# Patient Record
Sex: Male | Born: 1960 | Hispanic: No | Marital: Married | Smoking: Never smoker
Health system: Southern US, Community
[De-identification: ages and names within clinical notes are randomized; demographics above are authoritative.]

## PROBLEM LIST (undated history)

## (undated) DIAGNOSIS — E079 Disorder of thyroid, unspecified: Secondary | ICD-10-CM

## (undated) HISTORY — DX: Disorder of thyroid, unspecified: E07.9

## (undated) HISTORY — PX: NASAL SEPTUM SURGERY: SHX37

## (undated) HISTORY — PX: INGUINAL HERNIA REPAIR: SUR1180

---

## 2008-11-05 ENCOUNTER — Ambulatory Visit: Payer: Self-pay | Admitting: Internal Medicine

## 2008-11-10 HISTORY — PX: COLONOSCOPY: SHX174

## 2008-11-13 ENCOUNTER — Ambulatory Visit: Payer: Self-pay | Admitting: Internal Medicine

## 2012-05-10 HISTORY — PX: ANKLE FRACTURE SURGERY: SHX122

## 2013-08-22 ENCOUNTER — Encounter: Payer: Self-pay | Admitting: Internal Medicine

## 2013-10-23 ENCOUNTER — Encounter: Payer: Self-pay | Admitting: Internal Medicine

## 2013-10-31 ENCOUNTER — Encounter: Payer: Self-pay | Admitting: Internal Medicine

## 2013-12-16 ENCOUNTER — Ambulatory Visit (AMBULATORY_SURGERY_CENTER): Payer: Self-pay | Admitting: *Deleted

## 2013-12-16 VITALS — Ht 71.0 in | Wt 210.0 lb

## 2013-12-16 DIAGNOSIS — Z800 Family history of malignant neoplasm of digestive organs: Secondary | ICD-10-CM

## 2013-12-16 MED ORDER — MOVIPREP 100 G PO SOLR: ORAL | Status: DC

## 2013-12-16 NOTE — Progress Notes (Signed)
Patient denies any allergies to eggs or soy. Patient denies any problems with anesthesia/sedation. Patient denies any oxygen use at home and does not take any diet/weight loss medications. EMMI education assisgned to patient on colonoscopy, this was explained and instructions given to patient. 

## 2013-12-30 ENCOUNTER — Encounter: Payer: Self-pay | Admitting: Internal Medicine

## 2013-12-30 ENCOUNTER — Ambulatory Visit (AMBULATORY_SURGERY_CENTER): Payer: 59 | Admitting: Internal Medicine

## 2013-12-30 VITALS — BP 121/84 | HR 62 | Temp 96.7°F | Resp 19 | Ht 71.0 in | Wt 210.0 lb

## 2013-12-30 DIAGNOSIS — Z800 Family history of malignant neoplasm of digestive organs: Secondary | ICD-10-CM

## 2013-12-30 DIAGNOSIS — Z1211 Encounter for screening for malignant neoplasm of colon: Secondary | ICD-10-CM

## 2013-12-30 MED ORDER — SODIUM CHLORIDE 0.9 % IV SOLN
500.0000 mL | INTRAVENOUS | Status: DC
Start: 2013-12-30 — End: 2013-12-30

## 2013-12-30 NOTE — Progress Notes (Signed)
Report to PACU, RN, vss, BBS= Clear.  

## 2013-12-30 NOTE — Patient Instructions (Signed)
Discharge instructions given. Handouts on diverticulosis and a high fiber diet. Resume previous medications. YOU HAD AN ENDOSCOPIC PROCEDURE TODAY AT THE [facility]: Refer to the procedure report that was given to you for any specific questions about what was found during the examination.  If the procedure report does not answer your questions, please call your gastroenterologist to clarify.  If you requested that your care partner not be given the details of your procedure findings, then the procedure report has been included in a sealed envelope for you to review at your convenience later.  YOU SHOULD EXPECT: Some feelings of bloating in the abdomen. Passage of more gas than usual.  Walking can help get rid of the air that was put into your GI tract during the procedure and reduce the bloating. If you had a lower endoscopy (such as a colonoscopy or flexible sigmoidoscopy) you may notice spotting of blood in your stool or on the toilet paper. If you underwent a bowel prep for your procedure, then you may not have a normal bowel movement for a few days.  DIET: Your first meal following the procedure should be a light meal and then it is ok to progress to your normal diet.  A half-sandwich or bowl of soup is an example of a good first meal.  Heavy or fried foods are harder to digest and may make you feel nauseous or bloated.  Likewise meals heavy in dairy and vegetables can cause extra gas to form and this can also increase the bloating.  Drink plenty of fluids but you should avoid alcoholic beverages for 24 hours.  ACTIVITY: Your care partner should take you home directly after the procedure.  You should plan to take it easy, moving slowly for the rest of the day.  You can resume normal activity the day after the procedure however you should NOT DRIVE or use heavy machinery for 24 hours (because of the sedation medicines used during the test).    SYMPTOMS TO REPORT IMMEDIATELY: A  gastroenterologist can be reached at any hour.  During normal business hours, 8:30 AM to 5:00 PM Monday through Friday, call [phone].  After hours and on weekends, please call the GI answering service at [phone] who will take a message and have the physician on call contact you.   Following lower endoscopy (colonoscopy or flexible sigmoidoscopy):  Excessive amounts of blood in the stool  Significant tenderness or worsening of abdominal pains  Swelling of the abdomen that is new, acute  Fever of 100F or higher  FOLLOW UP: If any biopsies were taken you will be contacted by phone or by letter within the next 1-3 weeks.  Call your gastroenterologist if you have not heard about the biopsies in 3 weeks.  Our staff will call the home number listed on your records the next business day following your procedure to check on you and address any questions or concerns that you may have at that time regarding the information given to you following your procedure. This is a courtesy call and so if there is no answer at the home number and we have not heard from you through the emergency physician on call, we will assume that you have returned to your regular daily activities without incident.  SIGNATURES/CONFIDENTIALITY: You and/or your care partner have signed paperwork which will be entered into your electronic medical record.  These signatures attest to the fact that that the information above on your After Visit Summary   has been reviewed and is understood.  Full responsibility of the confidentiality of this discharge information lies with you and/or your care-partner. 

## 2013-12-30 NOTE — Op Note (Signed)
[facility]  [address] [place], [address]   COLONOSCOPY PROCEDURE REPORT  PATIENT: Randall Mason, Randall Mason  MR#: [id number] BIRTHDATE: 1961-01-08 , 63  yrs. old GENDER: male ENDOSCOPIST: Eustace Quail, MD REFERRED NB:VAPOLIDCV Recall, PROCEDURE DATE:  12/30/2013 PROCEDURE:   Colonoscopy, screening First Screening Colonoscopy - Avg.  risk and is 50 yrs.  old or older - No.  Prior Negative Screening - Now for repeat screening. N/A  History of Adenoma - Now for follow-up colonoscopy & has been > or = to 3 yrs.  N/A  Polyps Removed Today? No.  Recommend repeat exam, <10 yrs? Polyps Removed Today? No.  Recommend repeat exam, <10 yrs? Yes.  Polyps Removed Today? No.  Recommend repeat exam, <10 yrs? Yes.  High risk (family or personal hx). ASA CLASS:   Class II INDICATIONS:patient's immediate family history of colon cancer. Father at 95. Index examination November 2010-negative. MEDICATIONS: Monitored anesthesia care and Propofol 200 mg IV  DESCRIPTION OF PROCEDURE:   After the risks benefits and alternatives of the procedure were thoroughly explained, informed consent was obtained.  The digital rectal exam revealed no abnormalities of the rectum.   The LB [id number]  endoscope was introduced through the anus and advanced to the cecum, which was identified by both the appendix and ileocecal valve. No adverse events experienced.   The quality of the prep was excellent, using MoviPrep  The instrument was then slowly withdrawn as the colon was fully examined.    COLON FINDINGS: There was moderate diverticulosis noted in the left colon and transverse colon.   The examination was otherwise normal. Retroflexed views revealed internal hemorrhoids. The time to cecum=3 minutes 29 seconds.  Withdrawal time=9 minutes 47 seconds. The scope was withdrawn and the procedure completed.  COMPLICATIONS: There were no immediate complications.  ENDOSCOPIC IMPRESSION: 1.    Moderate diverticulosis was noted in the left colon and transverse colon 2.   The examination was otherwise normal  RECOMMENDATIONS: 1. Follow up colonoscopy in 5 years (family history)  eSigned:  Eustace Quail, MD 12/30/2013 11:03 AM   cc: Burnard Bunting, MD and The Patient

## 2013-12-31 ENCOUNTER — Telehealth: Payer: Self-pay

## 2013-12-31 NOTE — Telephone Encounter (Signed)
Left message on answering machine. 

## 2016-04-19 ENCOUNTER — Ambulatory Visit (INDEPENDENT_AMBULATORY_CARE_PROVIDER_SITE_OTHER): Payer: Self-pay

## 2016-04-19 ENCOUNTER — Ambulatory Visit (INDEPENDENT_AMBULATORY_CARE_PROVIDER_SITE_OTHER): Payer: 59

## 2016-04-19 ENCOUNTER — Other Ambulatory Visit (INDEPENDENT_AMBULATORY_CARE_PROVIDER_SITE_OTHER): Payer: Self-pay | Admitting: Orthopedic Surgery

## 2016-04-19 ENCOUNTER — Other Ambulatory Visit (INDEPENDENT_AMBULATORY_CARE_PROVIDER_SITE_OTHER): Payer: Self-pay | Admitting: *Deleted

## 2016-04-19 ENCOUNTER — Ambulatory Visit (INDEPENDENT_AMBULATORY_CARE_PROVIDER_SITE_OTHER): Payer: 59 | Admitting: Orthopaedic Surgery

## 2016-04-19 DIAGNOSIS — M5440 Lumbago with sciatica, unspecified side: Secondary | ICD-10-CM | POA: Diagnosis not present

## 2016-04-19 DIAGNOSIS — R102 Pelvic and perineal pain: Secondary | ICD-10-CM

## 2016-04-19 DIAGNOSIS — G8929 Other chronic pain: Secondary | ICD-10-CM

## 2016-04-19 NOTE — Addendum Note (Signed)
Addended by: Biagio Borg on: 04/19/2016 03:36 PM   Modules accepted: Orders

## 2016-04-19 NOTE — Progress Notes (Signed)
   Office Visit Note   Patient: Randall Mason           Date of Birth: 1960/03/06           MRN: [id number] Visit Date: 04/19/2016              Requested by: Burnard Bunting, MD [place], [place] [address] PCP: Geoffery Lyons, MD   Assessment & Plan: Visit Diagnoses:  1. Pelvic pain   2. Chronic low back pain with sciatica, sciatica laterality unspecified, unspecified back pain laterality   Probable compression fracture of L3 without complication  Plan: CT scan lumbar spine, return after study, lumbar support, continue with Celebrex for pain, urine analysis to rule out.hematuria  Follow-Up Instructions: No Follow-up on file.   Orders:  Orders Placed This Encounter  Procedures  . XR Lumbar Spine 2-3 Views  . XR Pelvis 1-2 Views   No orders of the defined types were placed in this encounter.     Procedures: No procedures performed   Clinical Data: No additional findings.   Subjective: Chief Complaint  Patient presents with  . Lower Back - Pain  . Injury    fell 7 ft landed on lower back, hx herniated disc issue    HPI Randall Mason noted initial onset of low back pain this past Saturday i.e. 3 days ago. He had climbed onto a brain barrel and slipped falling about 7-8 feet landing on his right hip on grass. He felt "entire lumbosacral spine pain" initially and was "sore all day. He was better by Sunday but still hasn't had significant relief of his pain since that time. He is experiencing low back pain with referred discomfort more to the right than to the left side. There is no buttock pain or lower extremity discomfort. He denies any bowel or bladder problems other than a "slight amount of blood my urine". He is on Celebrex. He denies any previous problems with the back pain Review of Systems   Objective: Vital Signs: There were no vitals taken for this visit.  Physical Exam  Ortho Exam straight leg raise negative bilaterally. Deep tendon  reflexes symmetrical. Neurovascular exam intact. No pain with range of motion of either hip. Prior right knee arthroscopy without any pain or effusion. No lower extremity swelling. No flank discomfort. No sacroiliac joint pain. No masses. No ecchymosis. Percussible tenderness at the lumbosacral junction.  Specialty Comments:  No specialty comments available.  Imaging: No results found.   PMFS History: There are no active problems to display for this patient.  Past Medical History:  Diagnosis Date  . Thyroid disease     Family History  Problem Relation Age of Onset  . Uterine cancer Mother   . Colon cancer Father 45    Past Surgical History:  Procedure Laterality Date  . ANKLE FRACTURE SURGERY Right 05/2012  . COLONOSCOPY  11/2008  . INGUINAL HERNIA REPAIR    . NASAL SEPTUM SURGERY     Social History   Occupational History  . Not on file.   Social History Main Topics  . Smoking status: Never Smoker  . Smokeless tobacco: Never Used  . Alcohol use Yes     Comment: occasional beer or wine monthly per pt/RM  . Drug use: No  . Sexual activity: Not on file

## 2016-04-20 ENCOUNTER — Ambulatory Visit (INDEPENDENT_AMBULATORY_CARE_PROVIDER_SITE_OTHER): Payer: Self-pay | Admitting: Orthopedic Surgery

## 2016-04-20 ENCOUNTER — Ambulatory Visit
Admission: RE | Admit: 2016-04-20 | Discharge: 2016-04-20 | Disposition: A | Discharge status: Home / Self Care | Payer: 59 | Source: Ambulatory Visit | Attending: Orthopaedic Surgery | Admitting: Orthopaedic Surgery

## 2016-04-20 DIAGNOSIS — M545 Low back pain: Secondary | ICD-10-CM | POA: Diagnosis not present

## 2016-04-20 DIAGNOSIS — G8929 Other chronic pain: Secondary | ICD-10-CM

## 2016-04-20 DIAGNOSIS — M5440 Lumbago with sciatica, unspecified side: Principal | ICD-10-CM

## 2016-04-20 LAB — URINALYSIS, ROUTINE W REFLEX MICROSCOPIC
BILIRUBIN URINE: NEGATIVE
Glucose, UA: NEGATIVE
Hgb urine dipstick: NEGATIVE
Ketones, ur: NEGATIVE
LEUKOCYTES UA: NEGATIVE
Nitrite: NEGATIVE
PROTEIN: NEGATIVE
Specific Gravity, Urine: 1.018 (ref 1.001–1.035)
pH: 6.5 (ref 5.0–8.0)

## 2016-04-20 LAB — URINE CULTURE: ORGANISM ID, BACTERIA: NO GROWTH

## 2016-04-21 ENCOUNTER — Ambulatory Visit (INDEPENDENT_AMBULATORY_CARE_PROVIDER_SITE_OTHER): Payer: 59 | Admitting: Orthopaedic Surgery

## 2016-05-02 ENCOUNTER — Ambulatory Visit (INDEPENDENT_AMBULATORY_CARE_PROVIDER_SITE_OTHER): Payer: 59 | Admitting: Orthopaedic Surgery

## 2016-05-02 VITALS — BP 131/74 | HR 73 | Ht 69.0 in | Wt 210.0 lb

## 2016-05-02 DIAGNOSIS — M545 Low back pain, unspecified: Secondary | ICD-10-CM

## 2016-05-02 NOTE — Progress Notes (Signed)
   Office Visit Note   Patient: Randall Mason           Date of Birth: 11/29/1960           MRN: [id number] Visit Date: 05/02/2016              Requested by: Burnard Bunting, MD [place], [place] [address] PCP: Geoffery Lyons, MD   Assessment & Plan: Visit Diagnoses:  1. Acute midline low back pain without sciatica   Acute compression fracture of the L3 vertebral body  Plan: Lumbar support, limited activity, return to the office 2-4 weeks for repeat films  Follow-Up Instructions: Return in about 1 month (around 06/01/2016).   Orders:  No orders of the defined types were placed in this encounter.  No orders of the defined types were placed in this encounter.     Procedures: No procedures performed   Clinical Data: No additional findings.   Subjective: Chief Complaint  Patient presents with  . Lower Back - Results    Mr. Brunty is a 56 y o that is here for CT lumbar results.  Caster is several weeks status post injury as previously described. He fell about 8 feet with acute onset of low back pain. Films demonstrated what appeared to be a compression fracture at L3. CT scan performed 04/20/2016 demonstrated a comminuted fracture through the central L3 vertebral body with 40-45% loss of vertebral body height. there was mild retropulsion of the posterior inferior endplate. The L3 pedicle and posterior elements appeared to be intact.  He has been wearing a lumbar support and doing quite well. He denies any numbness or tingling. Denies bowel or bladder dysfunction. He has minimal discomfort. He is completely aware of the diagnosis and is being very careful with his activities. HPI  Review of Systems   Objective: Vital Signs: BP 131/74   Pulse 73   Ht 5\' 9"  (1.753 m)   Wt 210 lb (95.3 kg)   BMI 31.01 kg/m   Physical Exam  Ortho Exam straight leg raise negative bilaterally reflexes are symmetrical. Motor and sensory exam intact. Very mild percussible  tenderness of the lower lumbar spine. Pelvis is level. No flank pain. Painless range of motion both hips  Specialty Comments:  No specialty comments available.  Imaging: No results found.   PMFS History: There are no active problems to display for this patient.  Past Medical History:  Diagnosis Date  . Thyroid disease     Family History  Problem Relation Age of Onset  . Uterine cancer Mother   . Colon cancer Father 12    Past Surgical History:  Procedure Laterality Date  . ANKLE FRACTURE SURGERY Right 05/2012  . COLONOSCOPY  11/2008  . INGUINAL HERNIA REPAIR    . NASAL SEPTUM SURGERY     Social History   Occupational History  . Not on file.   Social History Main Topics  . Smoking status: Never Smoker  . Smokeless tobacco: Never Used  . Alcohol use Yes     Comment: occasional beer or wine monthly per pt/RM  . Drug use: No  . Sexual activity: Not on file     Garald Balding, MD   Note - This record has been created using [employer].  Chart creation errors have been sought, but may not always  have been located. Such creation errors do not reflect on  the standard of medical care.

## 2016-06-23 ENCOUNTER — Ambulatory Visit (INDEPENDENT_AMBULATORY_CARE_PROVIDER_SITE_OTHER): Payer: 59

## 2016-06-23 ENCOUNTER — Ambulatory Visit (INDEPENDENT_AMBULATORY_CARE_PROVIDER_SITE_OTHER): Payer: 59 | Admitting: Orthopaedic Surgery

## 2016-06-23 ENCOUNTER — Encounter (INDEPENDENT_AMBULATORY_CARE_PROVIDER_SITE_OTHER): Payer: Self-pay | Admitting: Orthopaedic Surgery

## 2016-06-23 VITALS — BP 129/78 | HR 73 | Ht 70.0 in | Wt 220.0 lb

## 2016-06-23 DIAGNOSIS — M79672 Pain in left foot: Secondary | ICD-10-CM

## 2016-06-23 DIAGNOSIS — S32030D Wedge compression fracture of third lumbar vertebra, subsequent encounter for fracture with routine healing: Secondary | ICD-10-CM

## 2016-06-23 NOTE — Progress Notes (Signed)
Office Visit Note   Patient: Randall Mason           Date of Birth: 03-15-1960           MRN: [id number] Visit Date: 06/23/2016              Requested by: Burnard Bunting, MD [address] [place], [place] [address] PCP: Burnard Bunting, MD   Assessment & Plan: Visit Diagnoses:  1. Compression fracture of L3 lumbar vertebra with routine healing   2. Left foot pain   Compression fracture of L3 is stable. Left foot pain is consistent with a minimally displaced fracture of the second toe proximal phalanx  Plan: Discussed continue limitation of activities in relationship to the L3 compression fracture which is now over a month old and stable. No neurologic deficits. He needs to be careful for at least 3 months. We'll buddy tape the great and second toes left foot and wear good comfortable shoe. Return to the office in 4 weeks if no improvement.  Follow-Up Instructions: No Follow-up on file.   Orders:  Orders Placed This Encounter  Procedures  . XR Lumbar Spine 2-3 Views  . XR Foot Complete Left   No orders of the defined types were placed in this encounter.     Procedures: No procedures performed   Clinical Data: No additional findings.   Subjective: Chief Complaint  Patient presents with  . Left Foot - Injury, Pain, Edema    Mr. Perry is a 56 y o that presents with Left foot injury on Monday. He relates he slipped off a bottom step. Also, he has a "knot" on his Right knee, hx of lateral and medial meniscus tear  Mr. Dewey is over 2 months status post compression fracture of L3. He denies any numbness or tingling or significant pain. 3 days ago he "stopped" his left foot as he was walking down the stairs. He's had persistent pain associated with swelling and black and blue discoloration of the left second toe. He also notes that on occasion he'll have some discomfort along the lateral aspect of his right knee with no injury or trauma believes he might have some  early arthritis is comfortable to present time it doesn't think he needs to be evaluated but just wanted to mention   HPI  Review of Systems   Objective: Vital Signs: Ht 5\' 10"  (1.778 m)   Wt 220 lb (99.8 kg)   BMI 31.57 kg/m   Physical Exam  Ortho ExamNo percussible tenderness of lumbar spine. Flexion and extension would of the lumbar spine without pain painless range of motion of both hips with internal/external rotation. Straight leg raise negative. No effusion right knee but does have some palpable osteophytes on the lateral compartment. No crepitation. Left second toe is moderately swollen. Tenderness at the very distal aspect of this proximal phalanx. No deformity. Skin intact. Ecchymosis about the distal aspect of the proximal phalanx.  Specialty Comments:  No specialty comments available.  Imaging: No results found.   PMFS History: There are no active problems to display for this patient.  Past Medical History:  Diagnosis Date  . Thyroid disease     Family History  Problem Relation Age of Onset  . Uterine cancer Mother   . Colon cancer Father 43    Past Surgical History:  Procedure Laterality Date  . ANKLE FRACTURE SURGERY Right 05/2012  . COLONOSCOPY  11/2008  . INGUINAL HERNIA REPAIR    . NASAL SEPTUM  SURGERY     Social History   Occupational History  . Not on file.   Social History Main Topics  . Smoking status: Never Smoker  . Smokeless tobacco: Never Used  . Alcohol use Yes     Comment: occasional beer or wine monthly per pt/RM  . Drug use: No  . Sexual activity: Not on file     Garald Balding, MD   Note - This record has been created using [employer].  Chart creation errors have been sought, but may not always  have been located. Such creation errors do not reflect on  the standard of medical care.

## 2016-07-01 ENCOUNTER — Ambulatory Visit (INDEPENDENT_AMBULATORY_CARE_PROVIDER_SITE_OTHER): Payer: 59 | Admitting: Orthopaedic Surgery

## 2016-07-04 ENCOUNTER — Ambulatory Visit (INDEPENDENT_AMBULATORY_CARE_PROVIDER_SITE_OTHER): Payer: 59 | Admitting: Orthopaedic Surgery

## 2016-10-04 DIAGNOSIS — Z23 Encounter for immunization: Secondary | ICD-10-CM | POA: Diagnosis not present

## 2016-12-05 DIAGNOSIS — E7849 Other hyperlipidemia: Secondary | ICD-10-CM | POA: Diagnosis not present

## 2016-12-05 DIAGNOSIS — R82998 Other abnormal findings in urine: Secondary | ICD-10-CM | POA: Diagnosis not present

## 2016-12-05 DIAGNOSIS — Z0000 Encounter for general adult medical examination without abnormal findings: Secondary | ICD-10-CM | POA: Diagnosis not present

## 2016-12-05 DIAGNOSIS — E038 Other specified hypothyroidism: Secondary | ICD-10-CM | POA: Diagnosis not present

## 2016-12-12 DIAGNOSIS — M1990 Unspecified osteoarthritis, unspecified site: Secondary | ICD-10-CM | POA: Diagnosis not present

## 2016-12-12 DIAGNOSIS — E7849 Other hyperlipidemia: Secondary | ICD-10-CM | POA: Diagnosis not present

## 2016-12-12 DIAGNOSIS — E038 Other specified hypothyroidism: Secondary | ICD-10-CM | POA: Diagnosis not present

## 2016-12-12 DIAGNOSIS — Z0000 Encounter for general adult medical examination without abnormal findings: Secondary | ICD-10-CM | POA: Diagnosis not present

## 2016-12-14 DIAGNOSIS — Z1212 Encounter for screening for malignant neoplasm of rectum: Secondary | ICD-10-CM | POA: Diagnosis not present

## 2016-12-16 DIAGNOSIS — L821 Other seborrheic keratosis: Secondary | ICD-10-CM | POA: Diagnosis not present

## 2016-12-16 DIAGNOSIS — D1801 Hemangioma of skin and subcutaneous tissue: Secondary | ICD-10-CM | POA: Diagnosis not present

## 2016-12-16 DIAGNOSIS — L570 Actinic keratosis: Secondary | ICD-10-CM | POA: Diagnosis not present

## 2016-12-16 DIAGNOSIS — Z85828 Personal history of other malignant neoplasm of skin: Secondary | ICD-10-CM | POA: Diagnosis not present

## 2017-06-09 ENCOUNTER — Other Ambulatory Visit: Payer: Self-pay

## 2017-06-09 ENCOUNTER — Ambulatory Visit (INDEPENDENT_AMBULATORY_CARE_PROVIDER_SITE_OTHER): Payer: 59

## 2017-06-09 ENCOUNTER — Ambulatory Visit (HOSPITAL_COMMUNITY)
Admission: EM | Admit: 2017-06-09 | Discharge: 2017-06-09 | Disposition: A | Discharge status: Home / Self Care | Payer: 59 | Attending: Family Medicine | Admitting: Family Medicine

## 2017-06-09 ENCOUNTER — Encounter (HOSPITAL_COMMUNITY): Payer: Self-pay

## 2017-06-09 DIAGNOSIS — S82122A Displaced fracture of lateral condyle of left tibia, initial encounter for closed fracture: Secondary | ICD-10-CM | POA: Diagnosis not present

## 2017-06-09 DIAGNOSIS — S82121A Displaced fracture of lateral condyle of right tibia, initial encounter for closed fracture: Secondary | ICD-10-CM

## 2017-06-09 DIAGNOSIS — S82141A Displaced bicondylar fracture of right tibia, initial encounter for closed fracture: Secondary | ICD-10-CM | POA: Diagnosis not present

## 2017-06-09 NOTE — ED Triage Notes (Signed)
Pt presents with complaints of being thrown off of a horse yesterday. Denies head trauma or LOC, complaints of knee pain. Pt had knee brace at home and crutches he has been using.

## 2017-06-09 NOTE — ED Provider Notes (Addendum)
[name]    CSN: [id number] Arrival date & time: 06/09/17  1051     History   Chief Complaint Chief Complaint  Patient presents with  . Leg Pain    HPI Randall Mason is a 57 y.o. male.   HPI  Patient was riding a horse yesterday and fell off.  He was on a trail ride with friends and family, his horse became "spooked" and threw him.  He states that he landed on his right leg, twisted, and fell underneath him.  Severe left knee pain.  He did get back on the horse and finish the ride.  He is been using ice overnight.  He is here for evaluation. Today the leg is swollen.  Is swollen from the knee on the way down to the foot.  The pain is in the lateral knee area.  He cannot put weight comfortably.  With range of motion he feels "click".  He states the leg feels unstable, and that will give out if he tries to put weight on it.  He states that he does have an orthopedic surgeon.  He has had a medial medial meniscectomy in this knee in the past.  He states he also was told he had an old torn ACL.  This was never repaired.  This knee does not usually cause him regular symptoms.  Past Medical History:  Diagnosis Date  . Thyroid disease     There are no active problems to display for this patient.   Past Surgical History:  Procedure Laterality Date  . ANKLE FRACTURE SURGERY Right 05/2012  . COLONOSCOPY  11/2008  . INGUINAL HERNIA REPAIR    . NASAL SEPTUM SURGERY         Home Medications    Prior to Admission medications   Medication Sig Start Date End Date Taking? Authorizing Provider  levothyroxine (SYNTHROID, LEVOTHROID) 137 MCG tablet Take 137 mcg by mouth daily before breakfast.   Yes [provider]    Family History Family History  Problem Relation Age of Onset  . Uterine cancer Mother   . Colon cancer Father 27    Social History Social History   Tobacco Use  . Smoking status: Never Smoker  . Smokeless tobacco: Never Used  Substance  Use Topics  . Alcohol use: Yes    Comment: occasional beer or wine monthly per pt/RM  . Drug use: No     Allergies   Patient has no known allergies.   Review of Systems Review of Systems  Constitutional: Negative for chills and fever.  HENT: Negative for ear pain and sore throat.   Eyes: Negative for pain and visual disturbance.  Respiratory: Negative for cough and shortness of breath.   Cardiovascular: Negative for chest pain and palpitations.  Gastrointestinal: Negative for abdominal pain and vomiting.  Genitourinary: Negative for dysuria and hematuria.  Musculoskeletal: Positive for arthralgias and gait problem. Negative for back pain.  Skin: Negative for color change and rash.  Neurological: Negative for seizures and syncope.  All other systems reviewed and are negative.    Physical Exam Triage Vital Signs ED Triage Vitals [06/09/17 1122]  Enc Vitals Group     BP (!) 146/107     Pulse Rate 87     Resp 18     Temp 98.4 F (36.9 C)     Temp src      SpO2 100 %     Weight      Height  Head Circumference      Peak Flow      Pain Score 6     Pain Loc      Pain Edu?      Excl. in [place]?    No data found.  Updated Vital Signs BP (!) 146/107   Pulse 87   Temp 98.4 F (36.9 C)   Resp 18   SpO2 100%   Visual Acuity Right Eye Distance:   Left Eye Distance:   Bilateral Distance:    Right Eye Near:   Left Eye Near:    Bilateral Near:     Physical Exam  Constitutional: He appears well-developed and well-nourished. No distress.  HENT:  Head: Normocephalic and atraumatic.  Mouth/Throat: Oropharynx is clear and moist.  Eyes: Pupils are equal, round, and reactive to light. Conjunctivae are normal.  Neck: Normal range of motion.  Cardiovascular: Normal rate.  Pulmonary/Chest: Effort normal. No respiratory distress.  Abdominal: Soft. He exhibits no distension.  Musculoskeletal: Normal range of motion. He exhibits no edema.  Right leg is swollen from the  knee down.  Foot is puffy.  Pulses are good.  Cap refill is good.  Sensation is good.  The calf is tender and taut.  There is tenderness to even light palpation of the proximal fibula and tibial tubercle.  Range of motion is limited.  Neurological: He is alert.  Skin: Skin is warm and dry.     UC Treatments / Results  Labs (all labs ordered are listed, but only abnormal results are displayed) Labs Reviewed - No data to display  EKG None  Radiology Dg Knee Complete 4 Views Right  Result Date: 06/09/2017 CLINICAL DATA:  Patient fell off horse.  Pain. EXAM: RIGHT KNEE - COMPLETE 4+ VIEW COMPARISON:  None. FINDINGS: There is a slightly comminuted fracture of the fibular head, minimal displacement. There is also a minimally depressed lateral tibial plateau fracture. Soft tissue swelling. Joint effusion is present. IMPRESSION: Minimally depressed lateral tibial plateau fracture. Slightly comminuted fracture of the fibular head. Electronically Signed   By: Staci Righter M.D.   On: 06/09/2017 11:49    Procedures Procedures (including critical care time)  Medications Ordered in UC Medications - No data to display  Initial Impression / Assessment and Plan / UC Course  I have reviewed the triage vital signs and the nursing notes.  Pertinent labs & imaging results that were available during my care of the patient were reviewed by me and considered in my medical decision making (see chart for details).     I called Dr. Sharol Given, who is on-call for [facility].  He emphasized the need for ice elevation and nonweightbearing in order to reduce the swelling and prevent compartment syndrome.  This is discussed with the patient and his wife.  He will be seen Monday by his usual orthopedist.  He is given ibuprofen for pain. Final Clinical Impressions(s) / UC Diagnoses   Final diagnoses:  Closed fracture of lateral portion of right tibial plateau, initial encounter     Discharge Instructions      STRICT: elevate leg                Ice for 20 min every hour or two                No weight bearing  CALL:[facility] to be seen on Monday  Ibuprofen for pain  GO TO ER : if you have increased pain or swelling in  leg or pain/discoloration or loss of circulation to toes     ED Prescriptions    None     Controlled Substance Prescriptions [place] Controlled Substance Registry consulted? Not Applicable   Raylene Everts, MD 06/09/17 Cecille Amsterdam    Raylene Everts, MD 06/09/17 [phone]

## 2017-06-09 NOTE — Discharge Instructions (Signed)
STRICT: elevate leg                Ice for 20 min every hour or two                No weight bearing  CALL:[facility] to be seen on Monday  Ibuprofen for pain  GO TO ER : if you have increased pain or swelling in leg or pain/discoloration or loss of circulation to toes

## 2017-06-12 ENCOUNTER — Encounter (INDEPENDENT_AMBULATORY_CARE_PROVIDER_SITE_OTHER): Payer: Self-pay | Admitting: Orthopaedic Surgery

## 2017-06-12 ENCOUNTER — Ambulatory Visit (INDEPENDENT_AMBULATORY_CARE_PROVIDER_SITE_OTHER): Payer: 59 | Admitting: Orthopaedic Surgery

## 2017-06-12 VITALS — BP 152/92 | HR 81 | Ht 70.0 in | Wt 200.0 lb

## 2017-06-12 DIAGNOSIS — S82141A Displaced bicondylar fracture of right tibia, initial encounter for closed fracture: Secondary | ICD-10-CM | POA: Diagnosis not present

## 2017-06-12 NOTE — Progress Notes (Signed)
Office Visit Note   Patient: Randall Mason           Date of Birth: 10-02-1960           MRN: [id number] Visit Date: 06/12/2017              Requested by: Burnard Bunting, MD [address] [place], [place] [address] PCP: Burnard Bunting, MD   Assessment & Plan: Visit Diagnoses:  1. Closed fracture of right tibial plateau, initial encounter     Plan: Nondisplaced right fibular head fracture without peroneal nerve injury.  Appears to have a minimally impacted lateral tibial plateau fracture.  Continue with knee immobilizer and crutches.  CT scan of the knee  Follow-Up Instructions: Return after CT scan right knee.   Orders:  Orders Placed This Encounter  Procedures  . CT KNEE RIGHT WO CONTRAST   No orders of the defined types were placed in this encounter.     Procedures: No procedures performed   Clinical Data: No additional findings.   Subjective: Chief Complaint  Patient presents with  . Right Knee - Pain  . New Patient (Initial Visit)    R KNEE PAIN AND SWOLLEN CALF, THROWN OFF HORSE 5 DAYS AGO  Randall Mason was thrown from a horse 5 days ago landing on his right knee.  X-rays were obtained and reviewed on the PACS system.  There is a nondisplaced fibular head fracture.  There also appears to be a minimally impacted lateral tibial plateau fracture.  He denies any numbness or tingling.  Been wearing a knee immobilizer and using crutches  HPI  Review of Systems  Constitutional: Negative for fatigue and fever.  HENT: Negative for ear pain.   Eyes: Negative for pain.  Respiratory: Negative for cough and shortness of breath.   Cardiovascular: Positive for leg swelling.  Gastrointestinal: Negative for constipation and diarrhea.  Genitourinary: Negative for difficulty urinating.  Musculoskeletal: Negative for back pain and neck pain.  Skin: Negative for rash.  Allergic/Immunologic: Negative for food allergies.  Neurological: Positive for weakness and  numbness.  Hematological: Does not bruise/bleed easily.  Psychiatric/Behavioral: Positive for sleep disturbance.     Objective: Vital Signs: BP (!) 152/92 (BP Location: Left Arm, Patient Position: Sitting, Cuff Size: Normal)   Pulse 81   Ht 5\' 10"  (1.778 m)   Wt 200 lb (90.7 kg)   BMI 28.70 kg/m   Physical Exam  Constitutional: He is oriented to person, place, and time. He appears well-developed and well-nourished.  HENT:  Mouth/Throat: Oropharynx is clear and moist.  Eyes: Pupils are equal, round, and reactive to light. EOM are normal.  Pulmonary/Chest: Effort normal.  Neurological: He is alert and oriented to person, place, and time.  Skin: Skin is warm and dry.  Psychiatric: He has a normal mood and affect. His behavior is normal.    Ortho Exam awake alert and oriented x3.  Comfortable sitting.  Knee immobilizer was removed.  Compartments are supple.  Neurologically intact.  Specifically  peroneal nerves are functional.  Some tenderness over the right fibular head.  Positive effusion or hemarthrosis but able to flex 95 degrees.  Also has some tenderness over the lateral tibial plateau without ecchymosis or blistering Specialty Comments:  No specialty comments available.  Imaging: No results found.   PMFS History: There are no active problems to display for this patient.  Past Medical History:  Diagnosis Date  . Thyroid disease     Family History  Problem Relation Age  of Onset  . Uterine cancer Mother   . Colon cancer Father 3    Past Surgical History:  Procedure Laterality Date  . ANKLE FRACTURE SURGERY Right 05/2012  . COLONOSCOPY  11/2008  . INGUINAL HERNIA REPAIR    . NASAL SEPTUM SURGERY     Social History   Occupational History  . Not on file  Tobacco Use  . Smoking status: Never Smoker  . Smokeless tobacco: Never Used  Substance and Sexual Activity  . Alcohol use: Yes    Comment: occasional beer or wine monthly per pt/RM  . Drug use: No  .  Sexual activity: Not on file

## 2017-06-14 ENCOUNTER — Ambulatory Visit
Admission: RE | Admit: 2017-06-14 | Discharge: 2017-06-14 | Disposition: A | Discharge status: Home / Self Care | Payer: 59 | Source: Ambulatory Visit | Attending: Orthopaedic Surgery | Admitting: Orthopaedic Surgery

## 2017-06-14 DIAGNOSIS — S82141A Displaced bicondylar fracture of right tibia, initial encounter for closed fracture: Secondary | ICD-10-CM

## 2017-06-14 DIAGNOSIS — S82121A Displaced fracture of lateral condyle of right tibia, initial encounter for closed fracture: Secondary | ICD-10-CM | POA: Diagnosis not present

## 2017-06-15 ENCOUNTER — Ambulatory Visit (INDEPENDENT_AMBULATORY_CARE_PROVIDER_SITE_OTHER): Payer: 59 | Admitting: Orthopaedic Surgery

## 2017-06-15 ENCOUNTER — Encounter (INDEPENDENT_AMBULATORY_CARE_PROVIDER_SITE_OTHER): Payer: Self-pay | Admitting: Orthopaedic Surgery

## 2017-06-15 DIAGNOSIS — M25561 Pain in right knee: Secondary | ICD-10-CM | POA: Diagnosis not present

## 2017-06-15 NOTE — Progress Notes (Signed)
   Office Visit Note   Patient: Randall Mason           Date of Birth: [birth year]           MRN: [id number] Visit Date: 06/15/2017              Requested by: Burnard Bunting, MD [address] [place], [place] [address] PCP: Burnard Bunting, MD   Assessment & Plan: Visit Diagnoses:  1. Acute pain of right knee     Plan: CT scan of right knee was performed yesterday demonstrating degenerative changes in the lateral compartment.  There is also a lateral tibial plateau fracture predominate in the posterior aspect of the tibial plateau.  There is about 2 to 3 mm of depression with several small fragments.  Also a fracture of the fibular head without displacement.  Long discussion with Randall Mason regarding the fracture.  He already has significant arthritis in the lateral compartment.  We will treat the fracture nonoperatively with a knee immobilizer crutches.  Touchdown weightbearing for at least 6 weeks.  Neurologically is intact.  Office 2 weeks  Follow-Up Instructions: Return in about 2 weeks (around 06/29/2017).   Orders:  No orders of the defined types were placed in this encounter.  No orders of the defined types were placed in this encounter.     Procedures: No procedures performed   Clinical Data: No additional findings.   Subjective: No chief complaint on file. Feeling better and is right knee with "tightness".  Neurologically no change.  HPI  Review of Systems   Objective: Vital Signs: There were no vitals taken for this visit.  Physical Exam  Ortho Exam awake alert and oriented x3.  Comfortable sitting.  Randall Mason came in today with a knee brace using a cane.  I reviewed the CT scan with he and his wife and urged him to use the knee immobilizer and touchdown weightbearing with crutches.  He is neurologically intact.  There is less swelling of his knee.  The compartments are supple.  No calf pain.  No evidence of any peroneal nerve injury  Specialty  Comments:  No specialty comments available.  Imaging: No results found.   PMFS History: There are no active problems to display for this patient.  Past Medical History:  Diagnosis Date  . Thyroid disease     Family History  Problem Relation Age of Onset  . Uterine cancer Mother   . Colon cancer Father 49    Past Surgical History:  Procedure Laterality Date  . ANKLE FRACTURE SURGERY Right 05/2012  . COLONOSCOPY  11/2008  . INGUINAL HERNIA REPAIR    . NASAL SEPTUM SURGERY     Social History   Occupational History  . Not on file  Tobacco Use  . Smoking status: Never Smoker  . Smokeless tobacco: Never Used  Substance and Sexual Activity  . Alcohol use: Yes    Comment: occasional beer or wine monthly per pt/RM  . Drug use: No  . Sexual activity: Not on file     Randall Balding, MD   Note - This record has been created using [employer].  Chart creation errors have been sought, but may not always  have been located. Such creation errors do not reflect on  the standard of medical care.

## 2017-06-30 ENCOUNTER — Encounter (INDEPENDENT_AMBULATORY_CARE_PROVIDER_SITE_OTHER): Payer: Self-pay | Admitting: Orthopaedic Surgery

## 2017-06-30 ENCOUNTER — Ambulatory Visit (INDEPENDENT_AMBULATORY_CARE_PROVIDER_SITE_OTHER): Payer: 59 | Admitting: Orthopaedic Surgery

## 2017-06-30 ENCOUNTER — Ambulatory Visit (HOSPITAL_COMMUNITY)
Admission: RE | Admit: 2017-06-30 | Discharge: 2017-06-30 | Disposition: A | Discharge status: Home / Self Care | Payer: 59 | Source: Ambulatory Visit | Attending: Orthopaedic Surgery | Admitting: Orthopaedic Surgery

## 2017-06-30 VITALS — BP 157/100 | HR 77 | Ht 70.0 in | Wt 200.0 lb

## 2017-06-30 DIAGNOSIS — M79661 Pain in right lower leg: Secondary | ICD-10-CM

## 2017-06-30 DIAGNOSIS — M7121 Synovial cyst of popliteal space [Baker], right knee: Secondary | ICD-10-CM | POA: Insufficient documentation

## 2017-06-30 DIAGNOSIS — S82141D Displaced bicondylar fracture of right tibia, subsequent encounter for closed fracture with routine healing: Secondary | ICD-10-CM

## 2017-06-30 NOTE — Progress Notes (Signed)
Office Visit Note   Patient: Randall Mason           Date of Birth: Jan 07, 1961           MRN: [id number] Visit Date: 06/30/2017              Requested by: Burnard Bunting, MD [address] [place], [place] [address] PCP: Burnard Bunting, MD   Assessment & Plan: Visit Diagnoses:  1. Right calf pain   2. Closed fracture of right tibial plateau with routine healing, subsequent encounter     Plan: Mr. Binsfeld is 3 weeks status post minimally displaced lateral plateau fracture right knee.  He is been immobilized in a knee immobilizer using a cane and feeling quite comfortable.  His only issue is swelling of his right leg and calf.  He has been taking aspirin.  Denies shortness of breath or chest pain or thigh pain.  He is having minimal discomfort in his knee.  I will order a Doppler study to rule out DVT.  If negative we will see back in 2 to 3 weeks.  If positive I will call Dr. Reynaldo Minium his primary care physician  Follow-Up Instructions: Return in about 3 weeks (around 07/21/2017).   Orders:  No orders of the defined types were placed in this encounter.  No orders of the defined types were placed in this encounter.     Procedures: No procedures performed   Clinical Data: No additional findings.   Subjective: Chief Complaint  Patient presents with  . Right Knee - Follow-up  . Follow-up    r knee doing ok just having some calf swelling  3 weeks status post injury falling from a horse in which she sustained lateral tibial plateau fracture right knee.  CT scan revealed minimal displacement with significant pre-existing lateral compartment osteoarthritis.  We have treated him nonoperatively and is doing well except he is developed some swelling in his left leg and calf.  No shortness of breath or chest pain.  He has been taking aspirin for several weeks.  Having minimal discomfort in his right knee  HPI  Review of Systems  Constitutional: Negative for fatigue and fever.    HENT: Negative for ear pain.   Eyes: Negative for pain.  Respiratory: Negative for cough and shortness of breath.   Cardiovascular: Positive for leg swelling.  Gastrointestinal: Negative for constipation and diarrhea.  Genitourinary: Negative for difficulty urinating.  Musculoskeletal: Negative for back pain and neck pain.  Skin: Negative for rash.  Allergic/Immunologic: Negative for food allergies.  Neurological: Positive for numbness.  Hematological: Does not bruise/bleed easily.  Psychiatric/Behavioral: Positive for sleep disturbance.     Objective: Vital Signs: BP (!) 157/100 (BP Location: Left Arm, Patient Position: Sitting, Cuff Size: Normal)   Pulse 77   Ht 5\' 10"  (1.778 m)   Wt 200 lb (90.7 kg)   BMI 28.70 kg/m   Physical Exam  Constitutional: He is oriented to person, place, and time. He appears well-developed and well-nourished.  HENT:  Mouth/Throat: Oropharynx is clear and moist.  Eyes: Pupils are equal, round, and reactive to light. EOM are normal.  Pulmonary/Chest: Effort normal.  Neurological: He is alert and oriented to person, place, and time.  Skin: Skin is warm and dry.  Psychiatric: He has a normal mood and affect. His behavior is normal.    Ortho Exam awake alert and oriented x3 comfortable sitting.  Ambulates with a knee immobilizer of the right knee and a cane.  Right knee at full extension minimal effusion and flexed over 95 to 100 degrees.  No instability.  Minimal discomfort along the lateral compartment.  He does have some induration in his right leg from the calf distally associated with ankle swelling.  He does have some calf pain but minimally negative Homans sign.  Vascular exam intact  Specialty Comments:  No specialty comments available.  Imaging: No results found.   PMFS History: There are no active problems to display for this patient.  Past Medical History:  Diagnosis Date  . Thyroid disease     Family History  Problem Relation  Age of Onset  . Uterine cancer Mother   . Colon cancer Father 62    Past Surgical History:  Procedure Laterality Date  . ANKLE FRACTURE SURGERY Right 05/2012  . COLONOSCOPY  11/2008  . INGUINAL HERNIA REPAIR    . NASAL SEPTUM SURGERY     Social History   Occupational History  . Not on file  Tobacco Use  . Smoking status: Never Smoker  . Smokeless tobacco: Never Used  Substance and Sexual Activity  . Alcohol use: Yes    Comment: occasional beer or wine monthly per pt/RM  . Drug use: No  . Sexual activity: Not on file

## 2017-07-21 ENCOUNTER — Encounter (INDEPENDENT_AMBULATORY_CARE_PROVIDER_SITE_OTHER): Payer: Self-pay | Admitting: Orthopaedic Surgery

## 2017-07-21 ENCOUNTER — Ambulatory Visit (INDEPENDENT_AMBULATORY_CARE_PROVIDER_SITE_OTHER): Payer: Self-pay

## 2017-07-21 ENCOUNTER — Ambulatory Visit (INDEPENDENT_AMBULATORY_CARE_PROVIDER_SITE_OTHER): Payer: 59

## 2017-07-21 ENCOUNTER — Ambulatory Visit (INDEPENDENT_AMBULATORY_CARE_PROVIDER_SITE_OTHER): Payer: 59 | Admitting: Orthopaedic Surgery

## 2017-07-21 VITALS — BP 143/88 | HR 78 | Ht 70.0 in | Wt 200.0 lb

## 2017-07-21 DIAGNOSIS — S82141D Displaced bicondylar fracture of right tibia, subsequent encounter for closed fracture with routine healing: Secondary | ICD-10-CM

## 2017-07-21 NOTE — Progress Notes (Signed)
Office Visit Note   Patient: Randall Mason           Date of Birth: October 27, 1960           MRN: [id number] Visit Date: 07/21/2017              Requested by: Burnard Bunting, MD [address] [place], [place] [address] PCP: Burnard Bunting, MD   Assessment & Plan: Visit Diagnoses:  1. Closed fracture of right tibial plateau with routine healing, subsequent encounter     Plan: 6 weeks status post closed minimally depressed lateral tibial plateau fracture right knee with pre-existing lateral compartment osteoarthritis.  Actually doing fairly well with minimal pain.  X-rays reveal good position.  I will order a lateral unloading brace which I think will help his activities and check him back in a week.  Follow-Up Instructions: Return in about 1 month (around 08/21/2017).   Orders:  Orders Placed This Encounter  Procedures  . XR KNEE 3 VIEW RIGHT   No orders of the defined types were placed in this encounter.     Procedures: No procedures performed   Clinical Data: No additional findings.   Subjective: Chief Complaint  Patient presents with  . Follow-up    6 wk f/u calf just a little sore and starting to get movement back    HPI  Review of Systems  Constitutional: Negative for fatigue and fever.  HENT: Negative for ear pain.   Eyes: Negative for pain.  Respiratory: Negative for cough and shortness of breath.   Cardiovascular: Positive for leg swelling.  Gastrointestinal: Negative for constipation and diarrhea.  Genitourinary: Negative for difficulty urinating.  Musculoskeletal: Negative for back pain and neck pain.  Skin: Negative for rash.  Neurological: Positive for weakness. Negative for numbness.  Hematological: Does not bruise/bleed easily.  Psychiatric/Behavioral: Negative for sleep disturbance.     Objective: Vital Signs: BP (!) 143/88 (BP Location: Left Arm, Patient Position: Sitting, Cuff Size: Normal)   Pulse 78   Ht 5\' 10"  (1.778 m)   Wt 200  lb (90.7 kg)   BMI 28.70 kg/m   Physical Exam  Ortho Exam awake alert and oriented x3.  Comfortable sitting.  No effusion right knee.  Minimal tenderness along the lateral tibial plateau.  Skin intact.  Neurovascular exam no pain medially.  No patellar crepitation.  No calf pain.  No distal edema  Specialty Comments:  No specialty comments available.  Imaging: Xr Knee 3 View Right  Result Date: 07/21/2017 3 views of the right knee obtained in the standing projection.  Previously identified lateral tibial plateau fracture demonstrates what appears to be early healing.  There is no significant depression.  CT scan at the time of the injury noted about 1 to 2 mm of depression.  The lateral view reveals good position of the tibial plateau.      PMFS History: There are no active problems to display for this patient.  Past Medical History:  Diagnosis Date  . Thyroid disease     Family History  Problem Relation Age of Onset  . Uterine cancer Mother   . Colon cancer Father 47    Past Surgical History:  Procedure Laterality Date  . ANKLE FRACTURE SURGERY Right 05/2012  . COLONOSCOPY  11/2008  . INGUINAL HERNIA REPAIR    . NASAL SEPTUM SURGERY     Social History   Occupational History  . Not on file  Tobacco Use  . Smoking status: Never Smoker  .  Smokeless tobacco: Never Used  Substance and Sexual Activity  . Alcohol use: Yes    Comment: occasional beer or wine monthly per pt/RM  . Drug use: No  . Sexual activity: Not on file

## 2017-08-09 DIAGNOSIS — M1711 Unilateral primary osteoarthritis, right knee: Secondary | ICD-10-CM | POA: Diagnosis not present

## 2017-08-21 ENCOUNTER — Encounter (INDEPENDENT_AMBULATORY_CARE_PROVIDER_SITE_OTHER): Payer: Self-pay

## 2017-08-21 ENCOUNTER — Encounter (INDEPENDENT_AMBULATORY_CARE_PROVIDER_SITE_OTHER): Payer: Self-pay | Admitting: Orthopaedic Surgery

## 2017-08-21 ENCOUNTER — Ambulatory Visit (INDEPENDENT_AMBULATORY_CARE_PROVIDER_SITE_OTHER): Payer: 59 | Admitting: Orthopaedic Surgery

## 2017-08-21 VITALS — BP 156/95 | HR 76 | Ht 70.0 in | Wt 200.0 lb

## 2017-08-21 DIAGNOSIS — M25561 Pain in right knee: Secondary | ICD-10-CM

## 2017-08-21 NOTE — Progress Notes (Signed)
Office Visit Note   Patient: Randall Mason           Date of Birth: 1960/11/02           MRN: [id number] Visit Date: 08/21/2017              Requested by: Burnard Bunting, MD [address] [place], [place] [address] PCP: Burnard Bunting, MD   Assessment & Plan: Visit Diagnoses:  1. Acute pain of right knee     Plan: [name] is about 11 weeks status post essentially nondisplaced lateral tibial plateau fracture of his right knee.  There was pre-existing osteoarthritis.  He is doing well with occasional use of the unloading brace.  Able to perform activities around the yard and walks without ambulatory aid.  He feels always doing very well will encourage him to continue with exercises and plan to see him back as needed.  He is aware that the arthritis may flare up at any time in the lateral compartment  Follow-Up Instructions: No follow-ups on file.   Orders:  No orders of the defined types were placed in this encounter.  No orders of the defined types were placed in this encounter.     Procedures: No procedures performed   Clinical Data: No additional findings.   Subjective: Chief Complaint  Patient presents with  . Follow-up    1 MO F/U 06/09/17 HORSE THREW PT OFF. SURGERY ON R TIB/FIB CALF STILL HAS SOME WEAKNESS AND SLIGHT NUMBNESS BUT DOING MUCH BETTER     HPI  Review of Systems  Constitutional: Negative for fatigue and fever.  HENT: Negative for ear pain.   Eyes: Negative for pain.  Respiratory: Negative for cough and shortness of breath.   Cardiovascular: Negative for leg swelling.  Gastrointestinal: Negative for constipation and diarrhea.  Genitourinary: Negative for difficulty urinating.  Musculoskeletal: Positive for neck pain. Negative for back pain.  Skin: Negative for rash.  Allergic/Immunologic: Negative for food allergies.  Neurological: Positive for weakness and numbness.  Hematological: Does not bruise/bleed easily.    Psychiatric/Behavioral: Negative for sleep disturbance.     Objective: Vital Signs: BP (!) 156/95 (BP Location: Left Arm, Patient Position: Sitting, Cuff Size: Normal)   Pulse 76   Ht 5\' 10"  (1.778 m)   Wt 200 lb (90.7 kg)   BMI 28.70 kg/m   Physical Exam  Constitutional: He is oriented to person, place, and time. He appears well-developed and well-nourished.  HENT:  Mouth/Throat: Oropharynx is clear and moist.  Eyes: Pupils are equal, round, and reactive to light. EOM are normal.  Pulmonary/Chest: Effort normal.  Neurological: He is alert and oriented to person, place, and time.  Skin: Skin is warm and dry.  Psychiatric: He has a normal mood and affect. His behavior is normal.    Ortho Exam awake alert and oriented x3.  Comfortable sitting.  No effusion left or right knee.  No significant pain along the lateral compartment of the right knee.  Full extension and flexion over 105 degrees without instability.  Neurovascular exam intact.  Specialty Comments:  No specialty comments available.  Imaging: No results found.   PMFS History: There are no active problems to display for this patient.  Past Medical History:  Diagnosis Date  . Thyroid disease     Family History  Problem Relation Age of Onset  . Uterine cancer Mother   . Colon cancer Father 26    Past Surgical History:  Procedure Laterality Date  . ANKLE  FRACTURE SURGERY Right 05/2012  . COLONOSCOPY  11/2008  . INGUINAL HERNIA REPAIR    . NASAL SEPTUM SURGERY     Social History   Occupational History  . Not on file  Tobacco Use  . Smoking status: Never Smoker  . Smokeless tobacco: Never Used  Substance and Sexual Activity  . Alcohol use: Yes    Comment: occasional beer or wine monthly per pt/RM  . Drug use: No  . Sexual activity: Not on file

## 2017-10-02 DIAGNOSIS — Z23 Encounter for immunization: Secondary | ICD-10-CM | POA: Diagnosis not present

## 2017-12-12 DIAGNOSIS — Z0000 Encounter for general adult medical examination without abnormal findings: Secondary | ICD-10-CM | POA: Diagnosis not present

## 2017-12-12 DIAGNOSIS — R82998 Other abnormal findings in urine: Secondary | ICD-10-CM | POA: Diagnosis not present

## 2017-12-12 DIAGNOSIS — E038 Other specified hypothyroidism: Secondary | ICD-10-CM | POA: Diagnosis not present

## 2017-12-18 DIAGNOSIS — Z125 Encounter for screening for malignant neoplasm of prostate: Secondary | ICD-10-CM | POA: Diagnosis not present

## 2017-12-18 DIAGNOSIS — Z0000 Encounter for general adult medical examination without abnormal findings: Secondary | ICD-10-CM | POA: Diagnosis not present

## 2017-12-18 DIAGNOSIS — E7849 Other hyperlipidemia: Secondary | ICD-10-CM | POA: Diagnosis not present

## 2017-12-18 DIAGNOSIS — M1990 Unspecified osteoarthritis, unspecified site: Secondary | ICD-10-CM | POA: Diagnosis not present

## 2017-12-18 DIAGNOSIS — E038 Other specified hypothyroidism: Secondary | ICD-10-CM | POA: Diagnosis not present

## 2017-12-18 DIAGNOSIS — Z1389 Encounter for screening for other disorder: Secondary | ICD-10-CM | POA: Diagnosis not present

## 2017-12-19 DIAGNOSIS — Z1212 Encounter for screening for malignant neoplasm of rectum: Secondary | ICD-10-CM | POA: Diagnosis not present

## 2017-12-22 DIAGNOSIS — L821 Other seborrheic keratosis: Secondary | ICD-10-CM | POA: Diagnosis not present

## 2017-12-22 DIAGNOSIS — L570 Actinic keratosis: Secondary | ICD-10-CM | POA: Diagnosis not present

## 2017-12-22 DIAGNOSIS — D1801 Hemangioma of skin and subcutaneous tissue: Secondary | ICD-10-CM | POA: Diagnosis not present

## 2017-12-22 DIAGNOSIS — Z85828 Personal history of other malignant neoplasm of skin: Secondary | ICD-10-CM | POA: Diagnosis not present

## 2018-06-16 IMAGING — DX DG KNEE COMPLETE 4+V*R*
4 series · 4 of 4 positions shown · non-contrast
Comparison: None.

CLINICAL DATA: Patient fell off horse.  Pain.

EXAM:
RIGHT KNEE - COMPLETE 4+ VIEW

[knee ap]
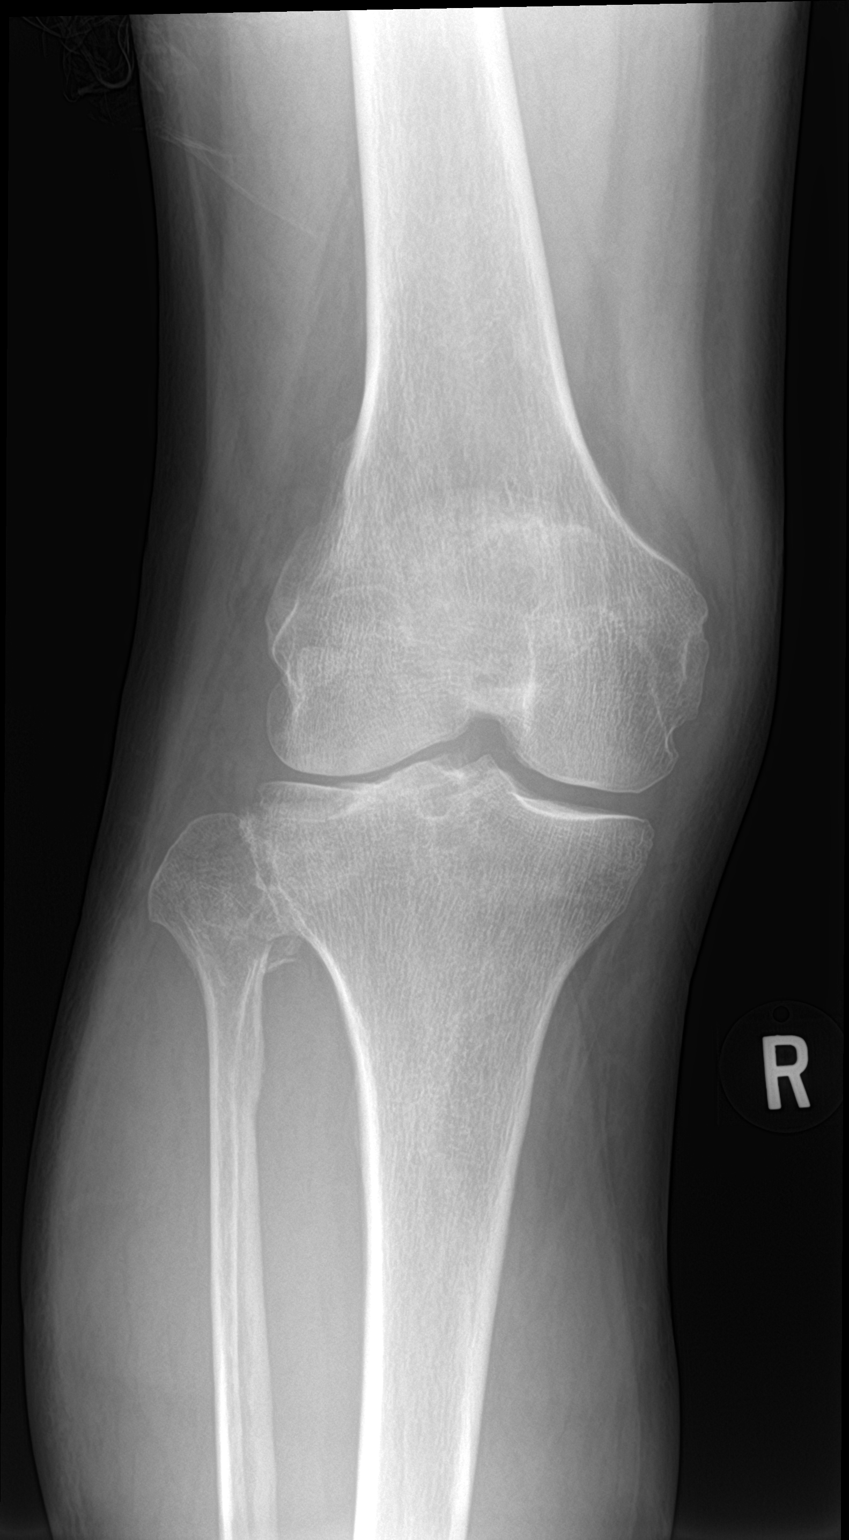

[knee obl (1 of 2)]
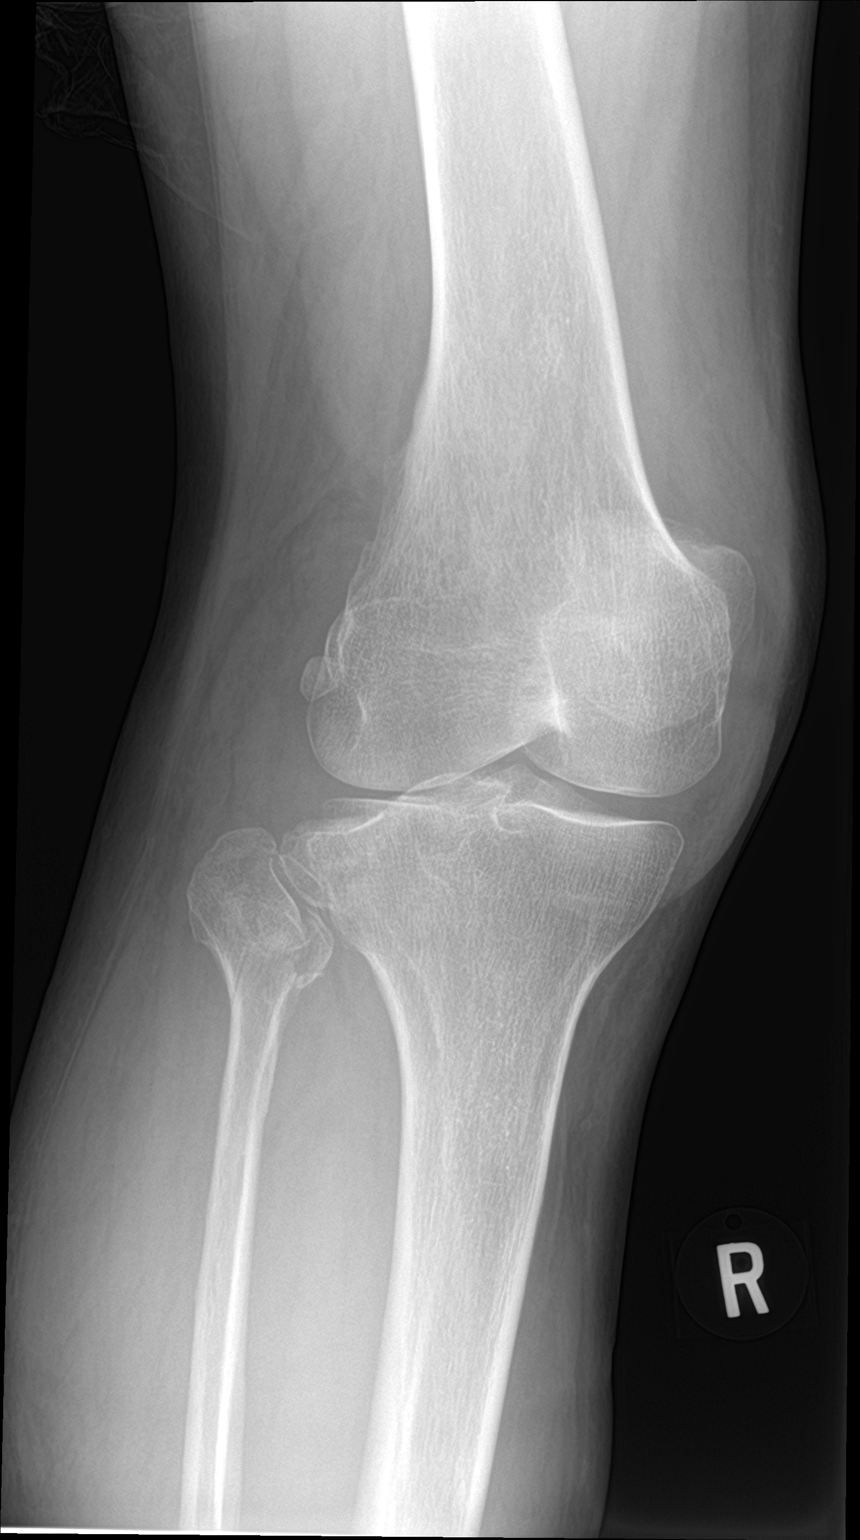

[knee obl (2 of 2)]
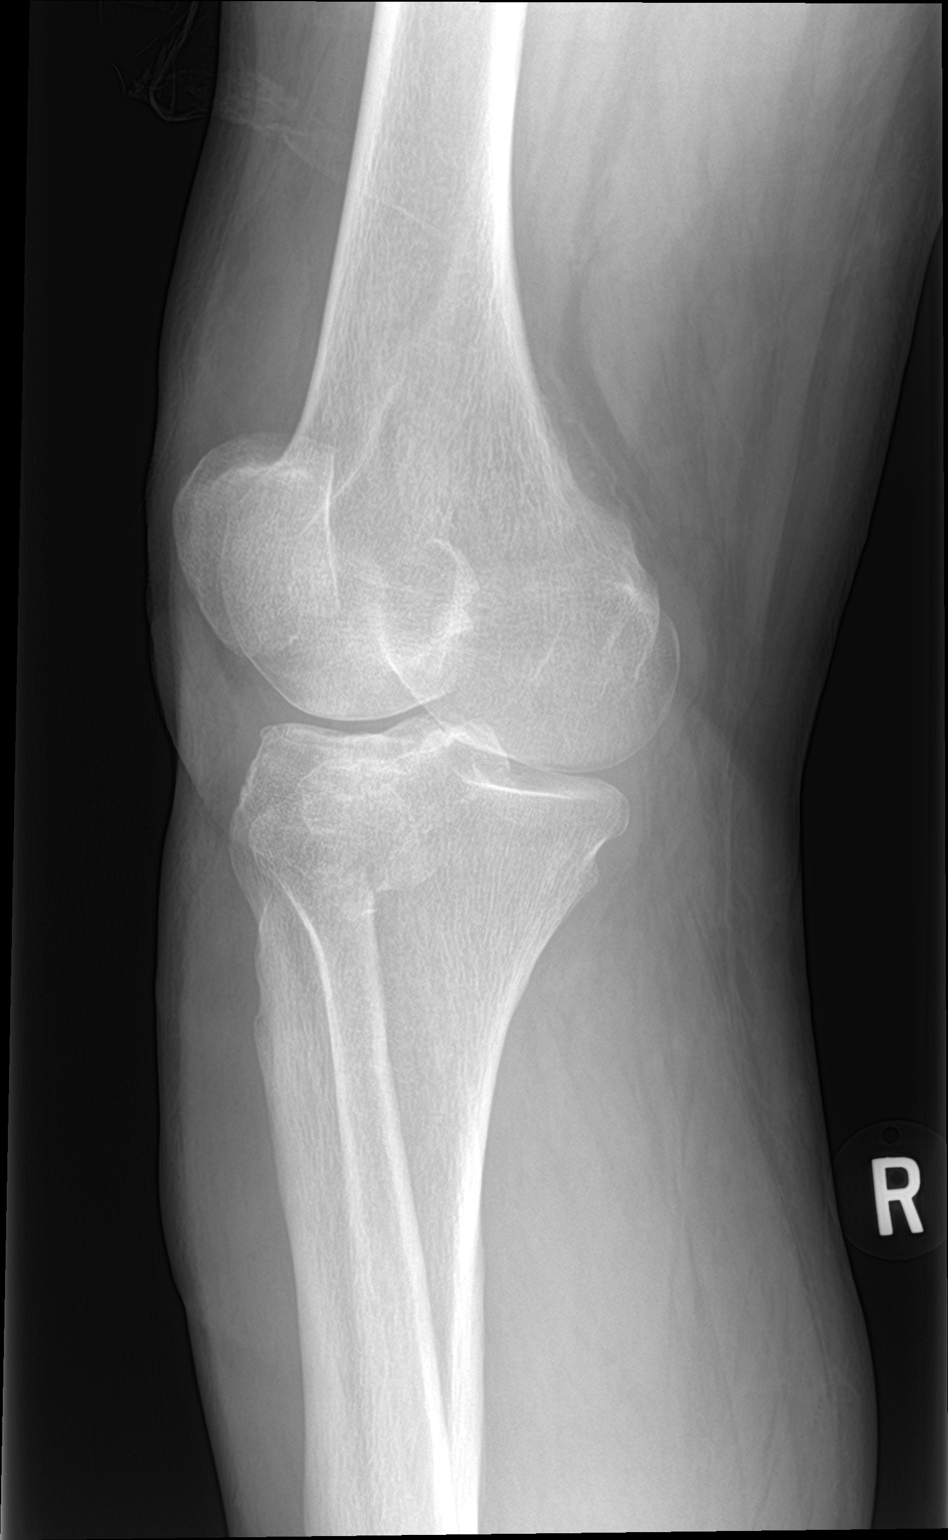

[knee lat]
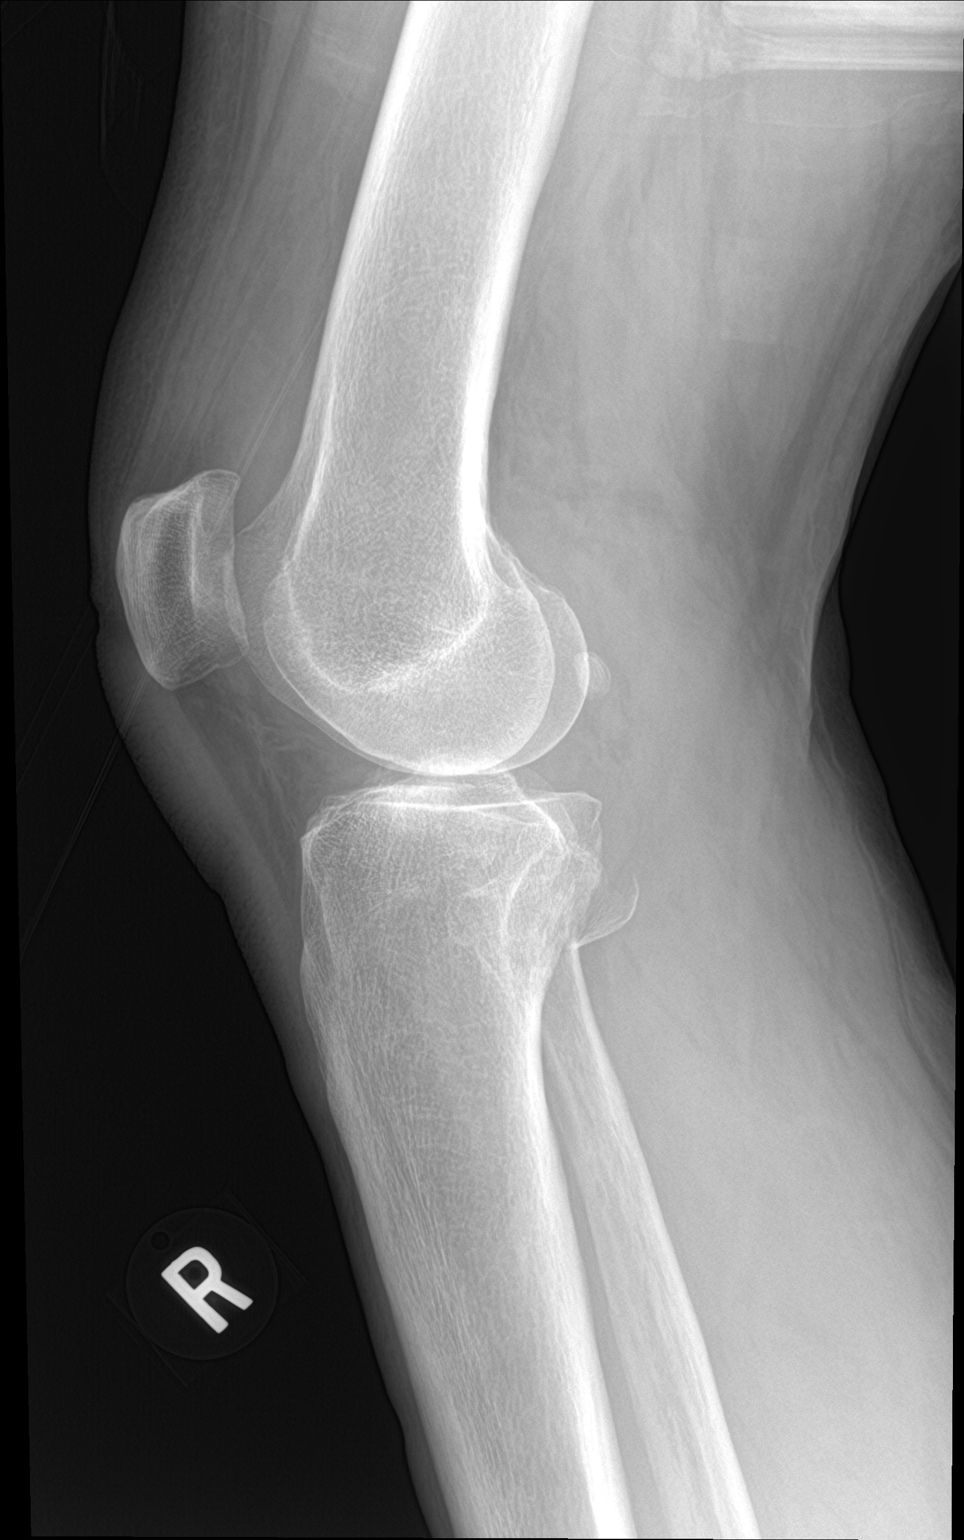

[4 of 4 positions shown; findings below may reference images not displayed]

FINDINGS: There is a slightly comminuted fracture of the fibular head, minimal
displacement. There is also a minimally depressed lateral tibial
plateau fracture. Soft tissue swelling. Joint effusion is present.
IMPRESSION: Minimally depressed lateral tibial plateau fracture. Slightly
comminuted fracture of the fibular head.

## 2018-12-12 ENCOUNTER — Encounter: Payer: Self-pay | Admitting: Internal Medicine

## 2018-12-17 ENCOUNTER — Encounter: Payer: Self-pay | Admitting: Internal Medicine

## 2019-01-16 ENCOUNTER — Other Ambulatory Visit: Payer: Self-pay

## 2019-01-16 ENCOUNTER — Ambulatory Visit (AMBULATORY_SURGERY_CENTER): Payer: 59 | Admitting: *Deleted

## 2019-01-16 ENCOUNTER — Encounter: Payer: Self-pay | Admitting: Internal Medicine

## 2019-01-16 VITALS — Temp 97.9°F | Ht 70.0 in | Wt 216.0 lb

## 2019-01-16 DIAGNOSIS — Z800 Family history of malignant neoplasm of digestive organs: Secondary | ICD-10-CM

## 2019-01-16 DIAGNOSIS — Z1159 Encounter for screening for other viral diseases: Secondary | ICD-10-CM

## 2019-01-16 MED ORDER — NA SULFATE-K SULFATE-MG SULF 17.5-3.13-1.6 GM/177ML PO SOLN: 1.0000 | Freq: Once | ORAL | 0 refills | Status: AC

## 2019-01-16 NOTE — Progress Notes (Signed)

## 2019-01-25 ENCOUNTER — Other Ambulatory Visit: Payer: Self-pay | Admitting: Internal Medicine

## 2019-01-25 ENCOUNTER — Ambulatory Visit (INDEPENDENT_AMBULATORY_CARE_PROVIDER_SITE_OTHER): Payer: 59

## 2019-01-25 DIAGNOSIS — Z1159 Encounter for screening for other viral diseases: Secondary | ICD-10-CM

## 2019-01-25 LAB — SARS CORONAVIRUS 2 (TAT 6-24 HRS): SARS Coronavirus 2: NEGATIVE

## 2019-01-30 ENCOUNTER — Encounter: Payer: Self-pay | Admitting: Internal Medicine

## 2019-01-30 ENCOUNTER — Ambulatory Visit (AMBULATORY_SURGERY_CENTER): Payer: 59 | Admitting: Internal Medicine

## 2019-01-30 ENCOUNTER — Other Ambulatory Visit: Payer: Self-pay

## 2019-01-30 VITALS — BP 128/81 | HR 73 | Temp 98.4°F | Resp 20 | Ht 70.0 in | Wt 216.0 lb

## 2019-01-30 DIAGNOSIS — Z1211 Encounter for screening for malignant neoplasm of colon: Secondary | ICD-10-CM

## 2019-01-30 DIAGNOSIS — Z800 Family history of malignant neoplasm of digestive organs: Secondary | ICD-10-CM

## 2019-01-30 MED ORDER — SODIUM CHLORIDE 0.9 % IV SOLN: 500.0000 mL | Freq: Once | INTRAVENOUS | Status: DC

## 2019-01-30 NOTE — Patient Instructions (Signed)
Impression/Recommendations:  Diverticulosis handout given to patient. Hemorrhoid handout given to patient.  Repeat colonoscopy in 5 years for surveillance.  YOU HAD AN ENDOSCOPIC PROCEDURE TODAY AT [facility]:   Refer to the procedure report that was given to you for any specific questions about what was found during the examination.  If the procedure report does not answer your questions, please call your gastroenterologist to clarify.  If you requested that your care partner not be given the details of your procedure findings, then the procedure report has been included in a sealed envelope for you to review at your convenience later.  YOU SHOULD EXPECT: Some feelings of bloating in the abdomen. Passage of more gas than usual.  Walking can help get rid of the air that was put into your GI tract during the procedure and reduce the bloating. If you had a lower endoscopy (such as a colonoscopy or flexible sigmoidoscopy) you may notice spotting of blood in your stool or on the toilet paper. If you underwent a bowel prep for your procedure, you may not have a normal bowel movement for a few days.  Please Note:  You might notice some irritation and congestion in your nose or some drainage.  This is from the oxygen used during your procedure.  There is no need for concern and it should clear up in a day or so.  SYMPTOMS TO REPORT IMMEDIATELY:   Following lower endoscopy (colonoscopy or flexible sigmoidoscopy):  Excessive amounts of blood in the stool  Significant tenderness or worsening of abdominal pains  Swelling of the abdomen that is new, acute  Fever of 100F or higher For urgent or emergent issues, a gastroenterologist can be reached at any hour by calling [phone].   DIET:  We do recommend a small meal at first, but then you may proceed to your regular diet.  Drink plenty of fluids but you should avoid alcoholic beverages for 24 hours.  ACTIVITY:  You should plan  to take it easy for the rest of today and you should NOT DRIVE or use heavy machinery until tomorrow (because of the sedation medicines used during the test).    FOLLOW UP: Our staff will call the number listed on your records 48-72 hours following your procedure to check on you and address any questions or concerns that you may have regarding the information given to you following your procedure. If we do not reach you, we will leave a message.  We will attempt to reach you two times.  During this call, we will ask if you have developed any symptoms of COVID 19. If you develop any symptoms (ie: fever, flu-like symptoms, shortness of breath, cough etc.) before then, please call [phone].  If you test positive for Covid 19 in the 2 weeks post procedure, please call and report this information to [place].    If any biopsies were taken you will be contacted by phone or by letter within the next 1-3 weeks.  Please call us at [phone] if you have not heard about the biopsies in 3 weeks.    SIGNATURES/CONFIDENTIALITY: You and/or your care partner have signed paperwork which will be entered into your electronic medical record.  These signatures attest to the fact that that the information above on your After Visit Summary has been reviewed and is understood.  Full responsibility of the confidentiality of this discharge information lies with you and/or your care-partner.

## 2019-01-30 NOTE — Progress Notes (Signed)
To PACU, vss. Report to [email or web address] 

## 2019-01-30 NOTE — Progress Notes (Signed)
Temp JB VS DT  Pt's states no medical or surgical changes since previsit or office visit. 

## 2019-01-30 NOTE — Op Note (Signed)
[name] Patient Name: Randall Mason Procedure Date: 01/30/2019 9:22 AM MRN: [id number] Endoscopist: Docia Chuck. Henrene Pastor , MD Age: 59 Referring MD:  Date of Birth: 03-28-1960 Gender: Male Account #: [id number] Procedure:                Colonoscopy Indications:              Colon cancer screening in patient at increased                            risk: Colorectal cancer in father. Previous                            examinations 2010 and 2015 both negative for                            neoplasia Medicines:                Monitored Anesthesia Care Procedure:                Pre-Anesthesia Assessment:                           - Prior to the procedure, a History and Physical                            was performed, and patient medications and                            allergies were reviewed. The patient's tolerance of                            previous anesthesia was also reviewed. The risks                            and benefits of the procedure and the sedation                            options and risks were discussed with the patient.                            All questions were answered, and informed consent                            was obtained. Prior Anticoagulants: The patient has                            taken no previous anticoagulant or antiplatelet                            agents. After reviewing the risks and benefits, the                            patient was deemed in satisfactory condition to  undergo the procedure.                           After obtaining informed consent, the colonoscope                            was passed under direct vision. Throughout the                            procedure, the patient's blood pressure, pulse, and                            oxygen saturations were monitored continuously. The                            Colonoscope was introduced through the anus and   advanced to the the cecum, identified by                            appendiceal orifice and ileocecal valve. The                            ileocecal valve, appendiceal orifice, and rectum                            were photographed. The quality of the bowel                            preparation was excellent. The colonoscopy was                            performed without difficulty. The patient tolerated                            the procedure well. The bowel preparation used was                            SUPREP via split dose instruction. Scope In: 9:24:17 AM Scope Out: [id number] AM Scope Withdrawal Time: 0 hours 9 minutes 21 seconds  Total Procedure Duration: 0 hours 13 minutes 54 seconds  Findings:                 Multiple diverticula were found in the entire colon.                           Internal hemorrhoids were found during retroflexion.                           The exam was otherwise without abnormality on                            direct and retroflexion views. Complications:            No immediate complications. Estimated blood loss:  None. Estimated Blood Loss:     Estimated blood loss: none. Impression:               - Diverticulosis in the entire examined colon.                           - Internal hemorrhoids.                           - The examination was otherwise normal on direct                            and retroflexion views.                           - No specimens collected. Recommendation:           - Repeat colonoscopy in 5 years for surveillance.                           - Patient has a contact number available for                            emergencies. The signs and symptoms of potential                            delayed complications were discussed with the                            patient. Return to normal activities tomorrow.                            Written discharge instructions were provided to the                             patient.                           - Resume previous diet.                           - Continue present medications. Docia Chuck. Henrene Pastor, MD 01/30/2019 9:53:20 AM This report has been signed electronically.

## 2019-02-01 ENCOUNTER — Telehealth: Payer: Self-pay | Admitting: *Deleted

## 2019-02-01 NOTE — Telephone Encounter (Signed)
  Follow up Call-  Call back number 01/30/2019  Post procedure Call Back phone  # [phone]  Permission to leave phone message Yes  Some recent data might be hidden     Patient questions:  Do you have a fever, pain , or abdominal swelling? No. Pain Score  0 *  Have you tolerated food without any problems? Yes.    Have you been able to return to your normal activities? Yes.    Do you have any questions about your discharge instructions: Diet   No. Medications  No. Follow up visit  No.  Do you have questions or concerns about your Care? No.  Actions: * If pain score is 4 or above: No action needed, pain <4.  1. Have you developed a fever since your procedure? no  2.   Have you had an respiratory symptoms (SOB or cough) since your procedure? no  3.   Have you tested positive for COVID 19 since your procedure no  4.   Have you had any family members/close contacts diagnosed with the COVID 19 since your procedure?  no   If yes to any of these questions please route to Joylene John, RN and Alphonsa Gin, Therapist, sports.

## 2019-08-02 ENCOUNTER — Other Ambulatory Visit: Payer: Self-pay | Admitting: Internal Medicine

## 2019-08-02 ENCOUNTER — Ambulatory Visit
Admission: RE | Admit: 2019-08-02 | Discharge: 2019-08-02 | Disposition: A | Discharge status: Home / Self Care | Payer: 59 | Source: Ambulatory Visit | Attending: Internal Medicine | Admitting: Internal Medicine

## 2019-08-02 DIAGNOSIS — I899 Noninfective disorder of lymphatic vessels and lymph nodes, unspecified: Secondary | ICD-10-CM

## 2019-08-02 MED ORDER — IOPAMIDOL (ISOVUE-300) INJECTION 61%
75.0000 mL | Freq: Once | INTRAVENOUS | Status: AC | PRN
  Administered 2019-08-02: 75 mL via INTRAVENOUS

## 2019-09-13 ENCOUNTER — Other Ambulatory Visit: Payer: Self-pay | Admitting: Otolaryngology
